# Patient Record
Sex: Female | Born: 1995 | Marital: Single
Health system: Northeastern US, Academic
[De-identification: ages and names within clinical notes are randomized; demographics above are authoritative.]

## PROBLEM LIST (undated history)

## (undated) ENCOUNTER — Inpatient Hospital Stay (HOSPITAL_COMMUNITY): Payer: Self-pay

## (undated) DIAGNOSIS — Z8669 Personal history of other diseases of the nervous system and sense organs: Secondary | ICD-10-CM

## (undated) DIAGNOSIS — Z973 Presence of spectacles and contact lenses: Secondary | ICD-10-CM

## (undated) DIAGNOSIS — J45909 Unspecified asthma, uncomplicated: Secondary | ICD-10-CM

## (undated) DIAGNOSIS — L988 Other specified disorders of the skin and subcutaneous tissue: Secondary | ICD-10-CM

## (undated) DIAGNOSIS — Z87798 Personal history of other (corrected) congenital malformations: Secondary | ICD-10-CM

---
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
## 2015-05-19 ENCOUNTER — Inpatient Hospital Stay (HOSPITAL_COMMUNITY)
Admission: AD | Admit: 2015-05-19 | Discharge: 2015-05-19 | Disposition: A | Discharge status: Home / Self Care | Payer: Self-pay | Source: Ambulatory Visit | Attending: Obstetrics & Gynecology | Admitting: Obstetrics & Gynecology

## 2015-05-19 DIAGNOSIS — N912 Amenorrhea, unspecified: Secondary | ICD-10-CM

## 2015-05-19 NOTE — MAU Provider Note (Signed)
Ms.Rillie Lajoyce Cornersvy is a 20 y.o. No obstetric history on file. at Unknown who presents to MAU today for pregnancy verification. The patient denies abdominal pain or vaginal bleeding today. The patient missed her period and desires a pregnancy test.   BP 121/66 mmHg  Pulse 95  Temp(Src) 99 F (37.2 C) (Oral)  Resp 16  SpO2 100%  LMP 04/13/2015  CONSTITUTIONAL: Well-developed, well-nourished female in no acute distress.  CARDIOVASCULAR: Regular heart rate RESPIRATORY: Normal effort NEUROLOGICAL: Alert and oriented to person, place, and time.  SKIN: Skin is warm and dry. No rash noted. Not diaphoretic. No erythema. No pallor. PSYCH: Normal mood and affect. Normal behavior. Normal judgment and thought content.  MDM Medical Screen Exam Complete  A: Amenorrhea  P: Discharge from MAU Patient advised to follow-up with [facility] for pregnancy confirmation  Monday-Thursday 8am-4pm or Friday 8am-11am Patient may return to MAU as needed or if her condition were to change or worsen   Duane LopeJennifer I Rasch, NP  05/19/2015 7:52 PM

## 2015-05-19 NOTE — MAU Note (Signed)
Pt reports LMP 04/13/2015, preg test always say "error:. Denies pain. Wants to confirm pregnant.

## 2015-07-14 ENCOUNTER — Ambulatory Visit (INDEPENDENT_AMBULATORY_CARE_PROVIDER_SITE_OTHER): Payer: 59 | Admitting: Urgent Care

## 2015-07-14 VITALS — BP 116/70 | HR 110 | Temp 99.4°F | Resp 18 | Ht 66.0 in | Wt 178.0 lb

## 2015-07-14 DIAGNOSIS — M791 Myalgia: Secondary | ICD-10-CM | POA: Diagnosis not present

## 2015-07-14 DIAGNOSIS — M7918 Myalgia, other site: Secondary | ICD-10-CM

## 2015-07-14 DIAGNOSIS — L0501 Pilonidal cyst with abscess: Secondary | ICD-10-CM | POA: Diagnosis not present

## 2015-07-14 LAB — POCT CBC
Granulocyte percent: 42.9 %G (ref 37–80)
HCT, POC: 34.7 % — AB (ref 37.7–47.9)
HEMOGLOBIN: 11.5 g/dL — AB (ref 12.2–16.2)
LYMPH, POC: 4.1 — AB (ref 0.6–3.4)
MCH, POC: 22.7 pg — AB (ref 27–31.2)
MCHC: 33.2 g/dL (ref 31.8–35.4)
MCV: 68.4 fL — AB (ref 80–97)
MID (cbc): 0.7 (ref 0–0.9)
MPV: 8.1 fL (ref 0–99.8)
PLATELET COUNT, POC: 225 10*3/uL (ref 142–424)
POC Granulocyte: 3.6 (ref 2–6.9)
POC LYMPH PERCENT: 49.3 %L (ref 10–50)
POC MID %: 7.8 % (ref 0–12)
RBC: 5.08 M/uL (ref 4.04–5.48)
RDW, POC: 16.5 %
WBC: 8.4 10*3/uL (ref 4.6–10.2)

## 2015-07-14 MED ORDER — TRAMADOL HCL 50 MG PO TABS: 50.0000 mg | ORAL_TABLET | Freq: Three times a day (TID) | ORAL | Status: DC | PRN

## 2015-07-14 MED ORDER — AMOXICILLIN-POT CLAVULANATE 875-125 MG PO TABS: 1.0000 | ORAL_TABLET | Freq: Two times a day (BID) | ORAL | Status: DC

## 2015-07-14 NOTE — Progress Notes (Signed)
    MRN: [id number] DOB: 09/24/1995  Subjective:   Chelsea Keith is a 20 y.o. female presenting for chief complaint of Recurrent Skin Infections  Reports 4 day history of painful bump over butt. Reports that yesterday, it spontaneously started draining pus and has continued to do so today. Had fever (highest was 101F) the past few days. Has used ibuprofen with some relief. Denies n/v, abdominal pain, dysuria, hematuria, constipation. Of note, the patient has had this problem before, was given antibiotics only ~1 year ago and problem never fully resolved.  Chelsea Keith currently has no medications in their medication list. Also has no allergies on file.  Chelsea Keith  has no past medical history on file. Also  has no past surgical history on file.  Objective:   Vitals: BP 116/70 mmHg  Pulse 110  Temp(Src) 99.4 F (37.4 C) (Oral)  Resp 18  Ht 5\' 6"  (1.676 m)  Wt 178 lb (80.74 kg)  BMI 28.74 kg/m2  SpO2 98%  Physical Exam  Constitutional: She is oriented to person, place, and time. She appears well-developed and well-nourished.  Cardiovascular:  Tachycardia.  Pulmonary/Chest: Effort normal.  Neurological: She is alert and oriented to person, place, and time.  Skin: Skin is warm and dry.       Results for orders placed or performed in visit on 07/14/15 (from the past 24 hour(s))  POCT CBC     Status: Abnormal   Collection Time: 07/14/15 12:53 PM  Result Value Ref Range   WBC 8.4 4.6 - 10.2 K/uL   Lymph, poc 4.1 (A) 0.6 - 3.4   POC LYMPH PERCENT 49.3 10 - 50 %L   MID (cbc) 0.7 0 - 0.9   POC MID % 7.8 0 - 12 %M   POC Granulocyte 3.6 2 - 6.9   Granulocyte percent 42.9 37 - 80 %G   RBC 5.08 4.04 - 5.48 M/uL   Hemoglobin 11.5 (A) 12.2 - 16.2 g/dL   HCT, POC 40.934.7 (A) 81.137.7 - 47.9 %   MCV 68.4 (A) 80 - 97 fL   MCH, POC 22.7 (A) 27 - 31.2 pg   MCHC 33.2 31.8 - 35.4 g/dL   RDW, POC 91.416.5 %   Platelet Count, POC 225 142 - 424 K/uL   MPV 8.1 0 - 99.8 fL    PROCEDURE NOTE: I&D of Pilonidal  Abscess Verbal consent obtained. Local anesthesia with 4cc of 1% lidocaine with epineprhine. Site cleansed with alcohol prep pad.  Incision of 2cm was made using a 11 blade, discharge of copious amounts of pus and serosanguinous fluid. Wound cavity was explored with curved hemostats with estimated depth of 3+cm. Wound aggressively packed with 1/2" plain packing. Cleansed and dressed.  Assessment and Plan :   1. Pilonidal abscess 2. Buttock pain - Will refer urgently to [facility] for management of pilonidal abscess. Wound culture pending, start Augmentin. - RTC as needed.   Wallis BambergMario Taliana Mersereau, PA-C [facility] [phone] 07/14/2015 12:35 PM

## 2015-07-14 NOTE — Addendum Note (Signed)
Addended by: Wallis BambergMANI, Johnanthony Wilden on: 07/14/2015 01:18 PM   Modules accepted: Level of Service

## 2015-07-14 NOTE — Patient Instructions (Signed)
Incision and Drainage of a Pilonidal Cyst Incision and drainage is a surgical procedure to open and drain a fluid-filled sac that forms around a hair follicle in the tailbone area between your buttocks (pilonidal cyst). You may need this procedure if the cyst becomes painful, swollen, or infected. There are three types of procedures that may be done. The type of procedure you have depends on the size and severity of your infected cyst. The procedure may be:  Incision and drainage with a special type of bandage (wound packing). Packing is used for wounds that are deep or tunnel under the skin.  Marsupialization. In this procedure, the cyst will be opened and kept open. The edges of the incision will be stitched together to make a pocket.  Incision and drainage without wound packing. LET YOUR HEALTH CARE PROVIDER KNOW ABOUT:  Any allergies you have.  All medicines you are taking, including vitamins, herbs, eye drops, creams, and over-the-counter medicines.  Previous problems you or members of your family have had with the use of anesthetics.  Any blood disorders you have.  Previous surgeries you have had.  Medical conditions you have. RISKS AND COMPLICATIONS Generally, this is a safe procedure. However, problems can occur and include:  Infection.  Bleeding.  Having another cyst develop.  Need for more surgery. BEFORE THE PROCEDURE  Ask your health care provider about:  Changing or stopping your regular medicines. This is especially important if you are taking diabetes medicines or blood thinners.  Taking medicines such as aspirin and ibuprofen. These medicines can thin your blood. Do not take these medicines before your procedure if your health care provider tells you not to.  Taking antibiotics before surgery to control the infection.  Do not eat or drink anything for 6-8 hours before the procedure if you are having general anesthesia.  Take a shower the night before the  procedure to clean your buttocks area. Take another shower in the morning before surgery.  Plan to have someone take you home after the procedure. PROCEDURE   You will have an IV tube inserted in a vein in your hand or arm.  You will be given one of the following:  A medicine that numbs the area (local anesthetic).  A medicine that makes you go to sleep (general anesthetic).  You also may be given medicine to help you relax during the procedure (sedative).  You will lie face down on the operating table.  Your buttocks area may be shaved.  Tape may be used to spread your buttocks.  Germ-killing solution (antiseptic) may be used to clean the area. Incision and Drainage With Wound Packing:  Your surgeon will make a surgical cut (incision) over the cyst to open it.  A probe may be used to see if there are tunnels extending away from the cyst under your skin.  Fluid or pus inside the cyst will be drained.  The cyst will be flushed out with a germ-free (sterile) solution.  Packing will be placed into the open cyst. This keeps it open and draining after surgery.  The area will be covered with a bandage (dressing). Marsupialization:  Your surgeon will make a surgical cut (incision) over the cyst to open it.  A probe may be used to see if there are tunnels extending away from the cyst under your skin.  Fluid or pus inside the cyst will be drained.  The cyst will be flushed out with a germ-free (sterile) solution.  The edges of   the incision will be stitched (sutured) to the skin to keep it wide open. The cyst will not be packed.  A rolled-up bandage (dressing) will be taped over the incision. Incision and Drainage Without Packing:  Your surgeon will make a surgical cut (incision) over the cyst to open it.  A probe may be used to see if there are tunnels extending away from the cyst under your skin.  Fluid or pus inside the cyst will be drained.  The cyst will be  flushed out with a germ-free (sterile) solution.  Your surgeon may also remove the tissue around the opened cyst.  The incision then will be closed with stitches (sutures). It will not be left open, and packing will not be used.  A bandage (dressing) will be put over the incision area. AFTER THE PROCEDURE  If you had general anesthesia, you will be taken to a recovery area. Your blood pressure, heart rate, breathing rate, and blood oxygen level will be monitored often until the medicines you were given have worn off.  It is normal to have some pain after this procedure. You may be given pain medicine.  Your IV tube can be taken out after you have recovered and your pain is under control.   This information is not intended to replace advice given to you by your health care provider. Make sure you discuss any questions you have with your health care provider.   Document Released: 07/15/2013 Document Reviewed: 07/15/2013 Elsevier Interactive Patient Education 2016 [employer]  

## 2015-07-17 LAB — WOUND CULTURE
GRAM STAIN: NONE SEEN
GRAM STAIN: NONE SEEN
Gram Stain: NONE SEEN

## 2015-07-18 ENCOUNTER — Encounter: Payer: Self-pay | Admitting: Urgent Care

## 2015-08-11 ENCOUNTER — Other Ambulatory Visit: Payer: Self-pay | Admitting: General Surgery

## 2015-08-11 NOTE — H&P (Signed)
History of Present Illness Chelsea Levee MD; 08/11/2015 3:04 PM) The patient is a 20 year old female who presents with a pilonidal cyst. Chelsea Keith is a 20 year old patient referred to [place] by Chelsea Bamberg, PA-C with [facility]. She presents with a history of pilonidal abscess that was I&D at the urgent care on 7/13. This is the second episode of this for her that first presented about a year ago. She said that one spontaneously drained and resolved. She reports having had pain, swelling, redness and fever to 101. She was placed on Augmentin at the urgent care. She says the pain is much better now.   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 Alcohol use Remotely quit alcohol use. Caffeine use Coffee, Tea.  Family History Chelsea Levee, MD; 08/11/2015 3:04 PM) Family history unknown First Degree Relatives  Pregnancy / Birth History Chelsea Levee, MD; 08/11/2015 3:04 PM) Age at menarche 11 years. Contraceptive History Depo-provera. Gravida 0 Irregular periods Para 0     Review of Systems Chelsea Levee MD; 08/11/2015 3:04 PM) General Present- Appetite Loss. Not Present- Chills, Fatigue, Fever, Night Sweats, Weight Gain and Weight Loss. Skin Not Present- Change in Wart/Mole, Dryness, Hives, Jaundice, New Lesions,  Non-Healing Wounds, Rash and Ulcer. HEENT Not Present- Earache, Hearing Loss, Hoarseness, Nose Bleed, Oral Ulcers, Ringing in the Ears, Seasonal Allergies, Sinus Pain, Sore Throat, Visual Disturbances, Wears glasses/contact lenses and Yellow Eyes. Respiratory Not Present- Bloody sputum, Chronic Cough, Difficulty Breathing, Snoring and Wheezing. Breast Not Present- Breast Mass, Breast Pain, Nipple Discharge and Skin Changes. Cardiovascular Not Present- Chest Pain, Difficulty Breathing Lying Down, Leg Cramps, Palpitations, Rapid Heart Rate, Shortness of Breath and Swelling of Extremities. Gastrointestinal Not Present- Abdominal Pain, Bloating, Bloody Stool, Change in Bowel Habits, Chronic diarrhea, Constipation, Difficulty Swallowing, Excessive gas, Gets full quickly at meals, Hemorrhoids, Indigestion, Nausea, Rectal Pain and Vomiting. Female Genitourinary Not Present- Frequency, Nocturia, Painful Urination, Pelvic Pain and Urgency. Musculoskeletal Not Present- Back Pain, Joint Pain, Joint Stiffness, Muscle Pain, Muscle Weakness and Swelling of Extremities. Neurological Not Present- Decreased Memory, Fainting, Headaches, Numbness, Seizures, Tingling, Tremor, Trouble walking and Weakness. Psychiatric Not Present- Anxiety, Bipolar, Change in Sleep Pattern, Depression, Fearful and Frequent crying. Endocrine Not Present- Cold Intolerance, Excessive Hunger, Hair Changes, Heat Intolerance, Hot flashes and New Diabetes. Hematology Not Present- Blood Thinners, Easy Bruising, Excessive bleeding, Gland problems, HIV and Persistent Infections.  Vitals (Sonya Bynum CMA; 08/11/2015 2:50 PM) 08/11/2015 2:49 PM Weight: 177 lb Height: 60in Body Surface Area: 1.77 m Body Mass Index: 34.57 kg/m  Temp.: 79F(Temporal)  Pulse: 75 (Regular)  BP: 118/70 (Sitting, Left Arm, Standard)      Physical Exam Chelsea Levee MD; 08/11/2015 3:05 PM)  General Mental Status-Alert. General  Appearance-Cooperative, Not in acute distress. Orientation-Oriented X4.  Integumentary General Characteristics Overall examination of the patient's skin reveals - no rashes. Color - normal coloration of skin. Skin Moisture - normal skin moisture.  Head and Neck Head-normocephalic, atraumatic with no lesions or palpable masses.  Chest and Lung Exam Chest and lung exam reveals -quiet, even and easy respiratory effort with no use of accessory muscles.  Rectal Note: There is a wound to the pilonidal area c/w recent I&D. No fluctuance. No purulent discharge. It is healing well  Neurologic Neurologic evaluation reveals -normal attention span and ability to concentrate and able to name objects and repeat phrases. Appropriate fund of knowledge .  Musculoskeletal Global Assessment Gait and Station - normal gait and station and normal posture.    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 On exam this appears to be healing well. She is requesting surgical excision. I think this is reasonable considering her recurrence. We discussed the surgery in detail. We discussed the small chance of recurrence even with the surgery. Her inflammation was more on the right side during her 2 infections. We discussed the possibility of prolonged wound healing as well.

## 2015-09-06 ENCOUNTER — Encounter (HOSPITAL_BASED_OUTPATIENT_CLINIC_OR_DEPARTMENT_OTHER): Payer: Self-pay | Admitting: *Deleted

## 2015-09-07 ENCOUNTER — Encounter (HOSPITAL_BASED_OUTPATIENT_CLINIC_OR_DEPARTMENT_OTHER): Payer: Self-pay | Admitting: *Deleted

## 2015-09-07 NOTE — Progress Notes (Signed)
NPO AFTER MN WITH EXCEPTION CLEAR LIQUIDS UNTIL 0730 (NO CREAM/ MILK PRODUCTS).  ARRIVE AT 1200. NEEDS HG AND URINE PREG.  

## 2015-09-09 ENCOUNTER — Ambulatory Visit (HOSPITAL_BASED_OUTPATIENT_CLINIC_OR_DEPARTMENT_OTHER): Payer: BLUE CROSS/BLUE SHIELD | Admitting: Anesthesiology

## 2015-09-09 ENCOUNTER — Encounter (HOSPITAL_BASED_OUTPATIENT_CLINIC_OR_DEPARTMENT_OTHER): Payer: Self-pay | Admitting: *Deleted

## 2015-09-09 ENCOUNTER — Ambulatory Visit (HOSPITAL_BASED_OUTPATIENT_CLINIC_OR_DEPARTMENT_OTHER)
Admission: RE | Admit: 2015-09-09 | Discharge: 2015-09-09 | Disposition: A | Discharge status: Home / Self Care | Payer: BLUE CROSS/BLUE SHIELD | Source: Ambulatory Visit | Attending: General Surgery | Admitting: General Surgery

## 2015-09-09 ENCOUNTER — Encounter (HOSPITAL_BASED_OUTPATIENT_CLINIC_OR_DEPARTMENT_OTHER)
Admission: RE | Disposition: A | Discharge status: Home / Self Care | Payer: Self-pay | Source: Ambulatory Visit | Attending: General Surgery

## 2015-09-09 DIAGNOSIS — L0591 Pilonidal cyst without abscess: Secondary | ICD-10-CM | POA: Insufficient documentation

## 2015-09-09 HISTORY — DX: Unspecified asthma, uncomplicated: J45.909

## 2015-09-09 HISTORY — PX: PILONIDAL CYST EXCISION: SHX744

## 2015-09-09 HISTORY — DX: Personal history of other diseases of the nervous system and sense organs: Z86.69

## 2015-09-09 HISTORY — DX: Other specified disorders of the skin and subcutaneous tissue: L98.8

## 2015-09-09 HISTORY — DX: Personal history of other (corrected) congenital malformations: Z87.798

## 2015-09-09 HISTORY — DX: Presence of spectacles and contact lenses: Z97.3

## 2015-09-09 LAB — POCT PREGNANCY, URINE: Preg Test, Ur: NEGATIVE

## 2015-09-09 LAB — POCT HEMOGLOBIN-HEMACUE: HEMOGLOBIN: 11.3 g/dL — AB (ref 12.0–15.0)

## 2015-09-09 SURGERY — EXCISION, PILONIDAL CYST, EXTENSIVE
Anesthesia: Monitor Anesthesia Care

## 2015-09-09 MED ORDER — OXYCODONE HCL 5 MG PO TABS
5.0000 mg | ORAL_TABLET | ORAL | Status: DC | PRN
  Filled 2015-09-09: qty 2

## 2015-09-09 MED ORDER — LACTATED RINGERS IV SOLN
INTRAVENOUS | Status: DC
  Administered 2015-09-09: 13:00:00 via INTRAVENOUS
  Filled 2015-09-09: qty 1000

## 2015-09-09 MED ORDER — FENTANYL CITRATE (PF) 100 MCG/2ML IJ SOLN
INTRAMUSCULAR | Status: AC
  Filled 2015-09-09: qty 2

## 2015-09-09 MED ORDER — LIDOCAINE 2% (20 MG/ML) 5 ML SYRINGE
INTRAMUSCULAR | Status: AC
  Filled 2015-09-09: qty 5

## 2015-09-09 MED ORDER — DEXAMETHASONE SODIUM PHOSPHATE 4 MG/ML IJ SOLN
INTRAMUSCULAR | Status: DC | PRN
  Administered 2015-09-09: 10 mg via INTRAVENOUS

## 2015-09-09 MED ORDER — ONDANSETRON HCL 4 MG/2ML IJ SOLN
4.0000 mg | Freq: Once | INTRAMUSCULAR | Status: DC | PRN
  Filled 2015-09-09: qty 2

## 2015-09-09 MED ORDER — HYDROCODONE-ACETAMINOPHEN 5-325 MG PO TABS: 1.0000 | ORAL_TABLET | ORAL | 0 refills | Status: DC | PRN

## 2015-09-09 MED ORDER — OXYCODONE HCL 5 MG PO TABS
5.0000 mg | ORAL_TABLET | Freq: Once | ORAL | Status: DC | PRN
  Filled 2015-09-09: qty 1

## 2015-09-09 MED ORDER — FENTANYL CITRATE (PF) 100 MCG/2ML IJ SOLN
INTRAMUSCULAR | Status: DC | PRN
  Administered 2015-09-09 (×2): 50 ug via INTRAVENOUS

## 2015-09-09 MED ORDER — SODIUM CHLORIDE 0.9 % IV SOLN
INTRAVENOUS | Status: DC | PRN
  Administered 2015-09-09: 20 ug/kg/min via INTRAVENOUS

## 2015-09-09 MED ORDER — ONDANSETRON HCL 4 MG/2ML IJ SOLN
INTRAMUSCULAR | Status: AC
  Filled 2015-09-09: qty 2

## 2015-09-09 MED ORDER — SODIUM CHLORIDE 0.9% FLUSH
3.0000 mL | INTRAVENOUS | Status: DC | PRN
  Filled 2015-09-09: qty 3

## 2015-09-09 MED ORDER — MIDAZOLAM HCL 2 MG/2ML IJ SOLN
INTRAMUSCULAR | Status: AC
  Filled 2015-09-09: qty 2

## 2015-09-09 MED ORDER — OXYCODONE HCL 5 MG/5ML PO SOLN
5.0000 mg | Freq: Once | ORAL | Status: DC | PRN
  Filled 2015-09-09: qty 5

## 2015-09-09 MED ORDER — LIDOCAINE 2% (20 MG/ML) 5 ML SYRINGE
INTRAMUSCULAR | Status: DC | PRN
  Administered 2015-09-09: 50 mg via INTRAVENOUS

## 2015-09-09 MED ORDER — BUPIVACAINE-EPINEPHRINE 0.5% -1:200000 IJ SOLN
INTRAMUSCULAR | Status: DC | PRN
  Administered 2015-09-09: 30 mL

## 2015-09-09 MED ORDER — ACETAMINOPHEN 500 MG PO TABS
ORAL_TABLET | ORAL | Status: AC
  Filled 2015-09-09: qty 2

## 2015-09-09 MED ORDER — SODIUM CHLORIDE 0.9% FLUSH
3.0000 mL | Freq: Two times a day (BID) | INTRAVENOUS | Status: DC
  Filled 2015-09-09: qty 3

## 2015-09-09 MED ORDER — ACETAMINOPHEN 650 MG RE SUPP
650.0000 mg | RECTAL | Status: DC | PRN
  Filled 2015-09-09: qty 1

## 2015-09-09 MED ORDER — MIDAZOLAM HCL 5 MG/5ML IJ SOLN
INTRAMUSCULAR | Status: DC | PRN
  Administered 2015-09-09: 2 mg via INTRAVENOUS

## 2015-09-09 MED ORDER — KETAMINE HCL 10 MG/ML IJ SOLN
INTRAMUSCULAR | Status: AC
  Filled 2015-09-09: qty 1

## 2015-09-09 MED ORDER — ACETAMINOPHEN 325 MG PO TABS
650.0000 mg | ORAL_TABLET | ORAL | Status: DC | PRN
  Filled 2015-09-09: qty 2

## 2015-09-09 MED ORDER — ACETAMINOPHEN 500 MG PO TABS
1000.0000 mg | ORAL_TABLET | ORAL | Status: AC
  Administered 2015-09-09: 1000 mg via ORAL
  Filled 2015-09-09: qty 2

## 2015-09-09 MED ORDER — GABAPENTIN 300 MG PO CAPS
300.0000 mg | ORAL_CAPSULE | ORAL | Status: AC
  Administered 2015-09-09: 300 mg via ORAL
  Filled 2015-09-09 (×2): qty 1

## 2015-09-09 MED ORDER — PROPOFOL 500 MG/50ML IV EMUL
INTRAVENOUS | Status: DC | PRN
  Administered 2015-09-09: 200 ug/kg/min via INTRAVENOUS

## 2015-09-09 MED ORDER — CEFAZOLIN SODIUM-DEXTROSE 2-4 GM/100ML-% IV SOLN
2.0000 g | INTRAVENOUS | Status: AC
  Administered 2015-09-09: 2 g via INTRAVENOUS
  Filled 2015-09-09: qty 100

## 2015-09-09 MED ORDER — MEPERIDINE HCL 25 MG/ML IJ SOLN
6.2500 mg | INTRAMUSCULAR | Status: DC | PRN
  Filled 2015-09-09: qty 1

## 2015-09-09 MED ORDER — PROPOFOL 500 MG/50ML IV EMUL
INTRAVENOUS | Status: AC
  Filled 2015-09-09: qty 50

## 2015-09-09 MED ORDER — SODIUM CHLORIDE 0.9 % IV SOLN
250.0000 mL | INTRAVENOUS | Status: DC | PRN
  Filled 2015-09-09: qty 250

## 2015-09-09 MED ORDER — HYDROMORPHONE HCL 1 MG/ML IJ SOLN
0.2500 mg | INTRAMUSCULAR | Status: DC | PRN
  Filled 2015-09-09: qty 1

## 2015-09-09 MED ORDER — CEFAZOLIN SODIUM-DEXTROSE 2-4 GM/100ML-% IV SOLN
INTRAVENOUS | Status: AC
  Filled 2015-09-09: qty 100

## 2015-09-09 MED ORDER — ONDANSETRON HCL 4 MG/2ML IJ SOLN
INTRAMUSCULAR | Status: DC | PRN
  Administered 2015-09-09: 4 mg via INTRAVENOUS

## 2015-09-09 SURGICAL SUPPLY — 48 items
BENZOIN TINCTURE PRP APPL 2/3 (GAUZE/BANDAGES/DRESSINGS) ×3 IMPLANT
BLADE HEX COATED 2.75 (ELECTRODE) ×3 IMPLANT
BLADE SURG 10 STRL SS (BLADE) ×3 IMPLANT
BLADE SURG 15 STRL LF DISP TIS (BLADE) ×1 IMPLANT
BLADE SURG 15 STRL SS (BLADE) ×2
BRIEF STRETCH FOR OB PAD LRG (UNDERPADS AND DIAPERS) ×3 IMPLANT
CANISTER SUCTION 2500CC (MISCELLANEOUS) ×3 IMPLANT
COVER BACK TABLE 60X90IN (DRAPES) ×3 IMPLANT
COVER MAYO STAND STRL (DRAPES) ×3 IMPLANT
DRAIN PENROSE 18X1/4 LTX STRL (WOUND CARE) ×3 IMPLANT
DRAPE LAPAROTOMY 100X72 PEDS (DRAPES) ×3 IMPLANT
DRAPE UTILITY XL STRL (DRAPES) ×3 IMPLANT
ELECT BLADE 6.5 .24CM SHAFT (ELECTRODE) IMPLANT
ELECT REM PT RETURN 9FT ADLT (ELECTROSURGICAL) ×3
ELECTRODE REM PT RTRN 9FT ADLT (ELECTROSURGICAL) ×1 IMPLANT
GAUZE SPONGE 4X4 16PLY NS LF (WOUND CARE) IMPLANT
GAUZE SPONGE 4X4 16PLY XRAY LF (GAUZE/BANDAGES/DRESSINGS) IMPLANT
GAUZE VASELINE 3X9 (GAUZE/BANDAGES/DRESSINGS) IMPLANT
GLOVE BIO SURGEON STRL SZ 6.5 (GLOVE) ×2 IMPLANT
GLOVE BIO SURGEONS STRL SZ 6.5 (GLOVE) ×1
GLOVE INDICATOR 7.0 STRL GRN (GLOVE) ×3 IMPLANT
GOWN STRL REUS W/ TWL LRG LVL3 (GOWN DISPOSABLE) ×1 IMPLANT
GOWN STRL REUS W/TWL 2XL LVL3 (GOWN DISPOSABLE) ×3 IMPLANT
GOWN STRL REUS W/TWL LRG LVL3 (GOWN DISPOSABLE) ×2
KIT ROOM TURNOVER WOR (KITS) ×3 IMPLANT
LIQUID BAND (GAUZE/BANDAGES/DRESSINGS) ×3 IMPLANT
NDL SAFETY ECLIPSE 18X1.5 (NEEDLE) IMPLANT
NEEDLE HYPO 18GX1.5 SHARP (NEEDLE)
NEEDLE HYPO 22GX1.5 SAFETY (NEEDLE) ×3 IMPLANT
NS IRRIG 500ML POUR BTL (IV SOLUTION) ×3 IMPLANT
PACK BASIN DAY SURGERY FS (CUSTOM PROCEDURE TRAY) ×3 IMPLANT
PAD ABD 8X10 STRL (GAUZE/BANDAGES/DRESSINGS) ×3 IMPLANT
PAD ARMBOARD 7.5X6 YLW CONV (MISCELLANEOUS) ×3 IMPLANT
PENCIL BUTTON HOLSTER BLD 10FT (ELECTRODE) ×3 IMPLANT
SOL PREP POV-IOD 16OZ 10% (MISCELLANEOUS) ×6 IMPLANT
SPONGE LAP 18X18 X RAY DECT (DISPOSABLE) IMPLANT
SPONGE LAP 4X18 X RAY DECT (DISPOSABLE) IMPLANT
SPONGE SURGIFOAM ABS GEL 12-7 (HEMOSTASIS) IMPLANT
SUT ETHILON 2 0 PS N (SUTURE) ×6 IMPLANT
SUT VIC AB 2-0 SH 18 (SUTURE) ×3 IMPLANT
SUT VIC AB 3-0 SH 18 (SUTURE) ×3 IMPLANT
SYR BULB IRRIGATION 50ML (SYRINGE) ×3 IMPLANT
SYR CONTROL 10ML LL (SYRINGE) ×3 IMPLANT
TOWEL OR 17X24 6PK STRL BLUE (TOWEL DISPOSABLE) ×6 IMPLANT
TRAY DSU PREP LF (CUSTOM PROCEDURE TRAY) ×3 IMPLANT
TUBE CONNECTING 12'X1/4 (SUCTIONS) ×1
TUBE CONNECTING 12X1/4 (SUCTIONS) ×2 IMPLANT
YANKAUER SUCT BULB TIP NO VENT (SUCTIONS) ×3 IMPLANT

## 2015-09-09 NOTE — Anesthesia Postprocedure Evaluation (Signed)
Anesthesia Post Note  Patient: Chelsea Keith  Procedure(s) Performed: Procedure(s) (LRB): EXCISION PILONIDAL CYST (N/A)  Patient location during evaluation: PACU Anesthesia Type: MAC Level of consciousness: awake and alert Pain management: pain level controlled Vital Signs Assessment: post-procedure vital signs reviewed and stable Respiratory status: spontaneous breathing, nonlabored ventilation, respiratory function stable and patient connected to nasal cannula oxygen Cardiovascular status: stable and blood pressure returned to baseline Anesthetic complications: no    Last Vitals:  Vitals:   09/09/15 1236 09/09/15 1432  BP: 115/67 110/69  Pulse: 80 88  Resp: 16 19  Temp: 37 C 36.9 C    Last Pain:  Vitals:   09/09/15 1432  TempSrc:   PainSc: Asleep                 Nicolas Sisler A

## 2015-09-09 NOTE — Anesthesia Procedure Notes (Signed)
Procedure Name: MAC Date/Time: 09/09/2015 1:40 PM Performed by: Ivin BootyREWS, DAVID Pre-anesthesia Checklist: Patient identified, Emergency Drugs available, Suction available, Patient being monitored and Timeout performed Patient Re-evaluated:Patient Re-evaluated prior to inductionOxygen Delivery Method: Nasal cannula Intubation Type: IV induction Placement Confirmation: positive ETCO2 and breath sounds checked- equal and bilateral

## 2015-09-09 NOTE — Interval H&P Note (Signed)
History and Physical Interval Note:  09/09/2015 1:37 PM  Chelsea Keith  has presented today for surgery, with the diagnosis of pilonidal disease  The various methods of treatment have been discussed with the patient and family. After consideration of risks, benefits and other options for treatment, the patient has consented to  Procedure(s): EXCISION PILONIDAL DISEASE (N/A) as a surgical intervention .  The patient's history has been reviewed, patient examined, no change in status, stable for surgery.  I have reviewed the patient's chart and labs.  Questions were answered to the patient's satisfaction.    Vanita PandaAlicia C Lanisha Stepanian, MD  Colorectal and General Surgery [facility]

## 2015-09-09 NOTE — Anesthesia Preprocedure Evaluation (Addendum)
Anesthesia Evaluation  Patient identified by MRN, date of birth, ID band Patient awake    Reviewed: Allergy & Precautions, NPO status , Patient's Chart, lab work & pertinent test results  Airway Mallampati: I       Dental  (+) Teeth Intact, Dental Advisory Given   Pulmonary    breath sounds clear to auscultation       Cardiovascular  Rhythm:Regular Rate:Normal     Neuro/Psych    GI/Hepatic   Endo/Other    Renal/GU      Musculoskeletal   Abdominal   Peds  Hematology   Anesthesia Other Findings   Reproductive/Obstetrics                             Anesthesia Physical Anesthesia Plan  ASA: II  Anesthesia Plan: MAC   Post-op Pain Management:    Induction: Intravenous  Airway Management Planned: Simple Face Mask  Additional Equipment:   Intra-op Plan:   Post-operative Plan:   Informed Consent: I have reviewed the patients History and Physical, chart, labs and discussed the procedure including the risks, benefits and alternatives for the proposed anesthesia with the patient or authorized representative who has indicated his/her understanding and acceptance.   Dental advisory given  Plan Discussed with: CRNA, Anesthesiologist and Surgeon  Anesthesia Plan Comments:         Anesthesia Quick Evaluation

## 2015-09-09 NOTE — Discharge Instructions (Addendum)
GENERAL SURGERY: POST OP INSTRUCTIONS  1. DIET: Follow a light bland diet the first 24 hours after arrival home, such as soup, liquids, crackers, etc.  Be sure to include lots of fluids daily.  Avoid fast food or heavy meals as your are more likely to get nauseated.   2. Take your usually prescribed home medications unless otherwise directed. 3. PAIN CONTROL: a. Pain is best controlled by a usual combination of three different methods TOGETHER: i. Ice/Heat ii. Over the counter pain medication iii. Prescription pain medication b. Most patients will experience some swelling and bruising around the incisions.  Ice packs or heating pads (30-60 minutes up to 6 times a day) will help. Use ice for the first few days to help decrease swelling and bruising, then switch to heat to help relax tight/sore spots and speed recovery.  Some people prefer to use ice alone, heat alone, alternating between ice & heat.  Experiment to what works for you.  Swelling and bruising can take several weeks to resolve.   c. It is helpful to take an over-the-counter pain medication regularly for the first few weeks.  Choose one of the following that works best for you: i. Naproxen (Aleve, etc)  Two 220mg  tabs twice a day ii. Ibuprofen (Advil, etc) Three 200mg  tabs four times a day (every meal & bedtime) d. A  prescription for pain medication (such as Percocet, oxycodone, hydrocodone, etc) should be given to you upon discharge.  Take your pain medication as prescribed.  i. If you are having problems/concerns with the prescription medicine (does not control pain, nausea, vomiting, rash, itching, etc), please call us [phone] to see if we need to switch you to a different pain medicine that will work better for you and/or control your side effect better. ii. If you need a refill on your pain medication, please contact your pharmacy.  They will contact our office to request authorization. Prescriptions will not be filled after 5  pm or on week-ends. 4. Avoid getting constipated.  Between the surgery and the pain medications, it is common to experience some constipation.  Increasing fluid intake and taking a fiber supplement (such as Metamucil, Citrucel, FiberCon, MiraLax, etc) 1-2 times a day regularly will usually help prevent this problem from occurring.  A mild laxative (prune juice, Milk of Magnesia, MiraLax, etc) should be taken according to package directions if there are no bowel movements after 48 hours.   5. Wash / shower every day.  You may take the dressing off to shower. 6.  Change your dressing every day and when it gets soiled.  Expect quite a bit of drainage over the first 1-2 weeks. 7. ACTIVITIES as tolerated:   a. You may resume regular (light) daily activities beginning the next day--such as daily self-care, walking, climbing stairs--gradually increasing activities as tolerated.  Leave more intense activity such as jogging, treadmill, bicycling, low-impact aerobics, swimming, etc. Until after you are 3 weeks out from surgery. b. Save the most intensive and strenuous activity for last such as sit-ups, heavy lifting, contact sports, etc  Refrain from any heavy lifting or straining until you are off narcotics for pain control.   c. DO NOT PUSH THROUGH PAIN.  Let pain be your guide: If it hurts to do something, don't do it.  Pain is your body warning you to avoid that activity for another week until the pain goes down. d. You may drive when you are no longer taking prescription pain medication, you  can comfortably wear a seatbelt, and you can safely maneuver your car and apply brakes. e. Bonita Quin may have sexual intercourse when it is comfortable.  8. FOLLOW UP in our office a. Please call [facility] at [phone] to set up an appointment to see your surgeon in the office for a follow-up appointment approximately 2-3 weeks after your surgery. b. Make sure that you call for this appointment the day you arrive home to insure  a convenient appointment time. 9. IF YOU HAVE DISABILITY OR FAMILY LEAVE FORMS, BRING THEM TO THE OFFICE FOR PROCESSING.  DO NOT GIVE THEM TO YOUR DOCTOR.   WHEN TO CALL us [phone]: 1. Poor pain control 2. Reactions / problems with new medications (rash/itching, nausea, etc)  3. Fever over 101.5 F (38.5 C) 4. Worsening swelling or bruising 5. Continued bleeding from incision. 6. Increased pain, redness, or drainage from the incision   The clinic staff is available to answer your questions during regular business hours (8:30am-5pm).  Please dont hesitate to call and ask to speak to one of our nurses for clinical concerns.   If you have a medical emergency, go to the nearest emergency room or call 911.  A surgeon from [facility] is always on call at the [facility], [place] [address], [place], [place]  [address] ? MAIN: [phone] ? TOLL FREE: [phone] ?  FAX [phone] [email or web address]   Post Anesthesia Home Care Instructions  Activity: Get plenty of rest for the remainder of the day. A responsible adult should stay with you for 24 hours following the procedure.  For the next 24 hours, DO NOT: -Drive a car -Advertising copywriter -Drink alcoholic beverages -Take any medication unless instructed by your physician -Make any legal decisions or sign important papers.  Meals: Start with liquid foods such as gelatin or soup. Progress to regular foods as tolerated. Avoid greasy, spicy, heavy foods. If nausea and/or vomiting occur, drink only clear liquids until the nausea and/or vomiting subsides. Call your physician if vomiting continues.  Special Instructions/Symptoms: Your throat may feel dry or sore from the anesthesia or the breathing tube placed in your throat during surgery. If this causes discomfort, gargle with warm salt water. The discomfort should disappear within 24 hours.  If you had a  scopolamine patch placed behind your ear for the management of post- operative nausea and/or vomiting:  1. The medication in the patch is effective for 72 hours, after which it should be removed.  Wrap patch in a tissue and discard in the trash. Wash hands thoroughly with soap and water. 2. You may remove the patch earlier than 72 hours if you experience unpleasant side effects which may include dry mouth, dizziness or visual disturbances. 3. Avoid touching the patch. Wash your hands with soap and water after contact with the patch.

## 2015-09-09 NOTE — H&P (View-Only) (Signed)
History of Present Illness Chelsea Keith(Marston Mccadden MD; 08/11/2015 3:04 PM) The patient is a 20 year old female who presents with a pilonidal cyst. Chelsea SonsBriana Keith is a 20 year old patient referred to us by Wallis BambergMario Mani, PA-C with [facility]. She presents with a history of pilonidal abscess that was I&D at the urgent care on 7/13. This is the second episode of this for her that first presented about a year ago. She said that one spontaneously drained and resolved. She reports having had pain, swelling, redness and fever to 101. She was placed on Augmentin at the urgent care. She says the pain is much better now.   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) PILONIDAL ABSCESS (L05.01)  Other Problems Chelsea Keith(Vipul Cafarelli, MD; 08/11/2015 3:04 PM) No pertinent past medical history  Past Surgical History Chelsea Keith(Josealfredo Adkins, MD; 08/11/2015 3:04 PM) No pertinent past surgical history  Diagnostic Studies History Chelsea Keith(Jagger Beahm, MD; 08/11/2015 3:04 PM) Colonoscopy never Pap Smear never  Allergies (Sonya Bynum, CMA; 08/11/2015 2:50 PM) No Known Drug Allergies 07/21/2015  Medication History (Sonya Bynum, CMA; 08/11/2015 2:50 PM) No Current Medications Medications Reconciled  Social History Chelsea Keith(Ladarrion Telfair, MD; 08/11/2015 3:04 PM) No drug use Tobacco use Never smoker. Alcohol use Remotely quit alcohol use. Caffeine use Coffee, Tea.  Family History Chelsea Keith(Laurens Matheny, MD; 08/11/2015 3:04 PM) Family history unknown First Degree Relatives  Pregnancy / Birth History Chelsea Keith(Kareem Aul, MD; 08/11/2015 3:04 PM) Age at menarche 11 years. Contraceptive History Depo-provera. Gravida 0 Irregular periods Para 0     Review of Systems Chelsea Keith(Tedrick Port MD; 08/11/2015 3:04 PM) General Present- Appetite Loss. Not Present- Chills, Fatigue, Fever, Night Sweats, Weight Gain and Weight Loss. Skin Not Present- Change in Wart/Mole, Dryness, Hives, Jaundice, New Lesions,  Non-Healing Wounds, Rash and Ulcer. HEENT Not Present- Earache, Hearing Loss, Hoarseness, Nose Bleed, Oral Ulcers, Ringing in the Ears, Seasonal Allergies, Sinus Pain, Sore Throat, Visual Disturbances, Wears glasses/contact lenses and Yellow Eyes. Respiratory Not Present- Bloody sputum, Chronic Cough, Difficulty Breathing, Snoring and Wheezing. Breast Not Present- Breast Mass, Breast Pain, Nipple Discharge and Skin Changes. Cardiovascular Not Present- Chest Pain, Difficulty Breathing Lying Down, Leg Cramps, Palpitations, Rapid Heart Rate, Shortness of Breath and Swelling of Extremities. Gastrointestinal Not Present- Abdominal Pain, Bloating, Bloody Stool, Change in Bowel Habits, Chronic diarrhea, Constipation, Difficulty Swallowing, Excessive gas, Gets full quickly at meals, Hemorrhoids, Indigestion, Nausea, Rectal Pain and Vomiting. Female Genitourinary Not Present- Frequency, Nocturia, Painful Urination, Pelvic Pain and Urgency. Musculoskeletal Not Present- Back Pain, Joint Pain, Joint Stiffness, Muscle Pain, Muscle Weakness and Swelling of Extremities. Neurological Not Present- Decreased Memory, Fainting, Headaches, Numbness, Seizures, Tingling, Tremor, Trouble walking and Weakness. Psychiatric Not Present- Anxiety, Bipolar, Change in Sleep Pattern, Depression, Fearful and Frequent crying. Endocrine Not Present- Cold Intolerance, Excessive Hunger, Hair Changes, Heat Intolerance, Hot flashes and New Diabetes. Hematology Not Present- Blood Thinners, Easy Bruising, Excessive bleeding, Gland problems, HIV and Persistent Infections.  Vitals (Sonya Bynum CMA; 08/11/2015 2:50 PM) 08/11/2015 2:49 PM Weight: 177 lb Height: 60in Body Surface Area: 1.77 m Body Mass Index: 34.57 kg/m  Temp.: 59F(Temporal)  Pulse: 75 (Regular)  BP: 118/70 (Sitting, Left Arm, Standard)      Physical Exam Chelsea Keith(Mearle Drew MD; 08/11/2015 3:05 PM)  General Mental Status-Alert. General  Appearance-Cooperative, Not in acute distress. Orientation-Oriented X4.  Integumentary General Characteristics Overall examination of the patient's skin reveals - no rashes. Color - normal coloration of skin. Skin Moisture - normal skin moisture.  Head and Neck Head-normocephalic, atraumatic with no lesions or palpable masses.  Chest and Lung Exam Chest and lung exam reveals -quiet, even and easy respiratory effort with no use of accessory muscles.  Rectal Note: There is a wound to the pilonidal area c/w recent I&D. No fluctuance. No purulent discharge. It is healing well  Neurologic Neurologic evaluation reveals -normal attention span and ability to concentrate and able to name objects and repeat phrases. Appropriate fund of knowledge .  Musculoskeletal Global Assessment Gait and Station - normal gait and station and normal posture.    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 On exam this appears to be healing well. She is requesting surgical excision. I think this is reasonable considering her recurrence. We discussed the surgery in detail. We discussed the small chance of recurrence even with the surgery. Her inflammation was more on the right side during her 2 infections. We discussed the possibility of prolonged wound healing as well.

## 2015-09-09 NOTE — Op Note (Signed)
09/09/2015  2:29 PM  PATIENT:  Chelsea Keith  20 y.o. female  Patient Care Team: No Pcp Per Patient as PCP - General (General Practice)  PRE-OPERATIVE DIAGNOSIS:  pilonidal disease  POST-OPERATIVE DIAGNOSIS:  pilonidal disease  PROCEDURE:  Procedure(s): EXCISION PILONIDAL CYST  SURGEON:  Surgeon(s): Romie LeveeAlicia Carlynn Leduc, MD  ASSISTANT: none   ANESTHESIA:   local  EBL:  Total I/O In: 500 [I.V.:500] Out: 5 [Blood:5]  DRAINS: Penrose drain in the wound bed   SPECIMEN:  No Specimen  DISPOSITION OF SPECIMEN:  N/A  COUNTS:  YES  PLAN OF CARE: Discharge to home after PACU  PATIENT DISPOSITION:  PACU - hemodynamically stable.  INDICATION: 20 year old female with 2 previous episodes of pilonidal infection requiring I&D.   OR FINDINGS: Midline pilonidal scar, no pilonidal pit identified  DESCRIPTION: the patient was identified in the preoperative holding area and taken to the OR where they were laid prone on the operating room table.  MAC anesthesia was induced without difficulty. SCDs were also noted to be in place prior to the initiation of anesthesia.  The patient was then prepped and draped in the usual sterile fashion.   A surgical timeout was performed indicating the correct patient, procedure, positioning and need for preoperative antibiotics.  A field block was then obtained using Marcaine with epinephrine for local anesthesia.   I began making an incision lateral to the wound on the patient's right gluteus area and this was carried down through the subcutaneous tissues and underneath the pocket of inflammation. Midline was crossed and the cavity was freed on the left side as well. Once all away around the cavity posteriorly, I began to separated anteriorly from the dermal layer using a combination of electrocautery and sharp dissection. Once this was completely free it was removed from the wound. The wound was irrigated with Betadine and saline. Hemostasis was good. The  subcutaneous tissue was reapproximated over a quarter inch Penrose drain in the bed of the wound. This was done using interrupted 2-0 Vicryl sutures. The dermal layer was closed using interrupted 3-0 Vicryl sutures. The skin was closed using interrupted 2-0 nylon mattress sutures. The drain was secured to the posterior aspect of the wound also using the nylon suture.  A sterile dressing was applied. The patient was awakened from anesthesia and sent to the post anesthesia care unit in stable condition. All counts were correct per operating room staff.

## 2015-09-09 NOTE — Transfer of Care (Signed)
Last Vitals:  Vitals:   09/09/15 1236 09/09/15 1432  BP: 115/67 (P) 110/69  Pulse: 80   Resp: 16 (P) 19  Temp: 37 C     Last Pain:  Vitals:   09/09/15 1236  TempSrc: Oral      Patients Stated Pain Goal: 5 (09/09/15 1304)  Immediate Anesthesia Transfer of Care Note  Patient: Chelsea Keith  Procedure(s) Performed: Procedure(s) (LRB): EXCISION PILONIDAL CYST (N/A)  Patient Location: PACU  Anesthesia Type: General  Level of Consciousness: awake, alert  and oriented  Airway & Oxygen Therapy: Patient Spontanous Breathing and Patient connected to nasal cannua oxygen  Post-op Assessment: Report given to PACU RN and Post -op Vital signs reviewed and stable  Post vital signs: Reviewed and stable  Complications: No apparent anesthesia complications

## 2015-09-12 ENCOUNTER — Encounter (HOSPITAL_BASED_OUTPATIENT_CLINIC_OR_DEPARTMENT_OTHER): Payer: Self-pay | Admitting: General Surgery

## 2017-01-18 ENCOUNTER — Other Ambulatory Visit: Payer: Self-pay

## 2017-01-18 ENCOUNTER — Ambulatory Visit: Payer: No Typology Code available for payment source | Admitting: Family Medicine

## 2017-01-18 ENCOUNTER — Encounter: Payer: Self-pay | Admitting: Family Medicine

## 2017-01-18 VITALS — BP 122/62 | HR 92 | Temp 98.0°F | Ht 65.35 in | Wt 203.0 lb

## 2017-01-18 DIAGNOSIS — H1032 Unspecified acute conjunctivitis, left eye: Secondary | ICD-10-CM | POA: Diagnosis not present

## 2017-01-18 MED ORDER — POLYMYXIN B-TRIMETHOPRIM 10000-0.1 UNIT/ML-% OP SOLN: 1.0000 [drp] | Freq: Four times a day (QID) | OPHTHALMIC | 0 refills | Status: DC

## 2017-01-18 NOTE — Patient Instructions (Addendum)
   IF you received an x-ray today, you will receive an invoice from [facility]. Please contact [facility] at [phone] with questions or concerns regarding your invoice.   IF you received labwork today, you will receive an invoice from [facility]. Please contact [facility] at [phone] with questions or concerns regarding your invoice.   Our billing staff will not be able to assist you with questions regarding bills from these companies.  You will be contacted with the lab results as soon as they are available. The fastest way to get your results is to activate your My Chart account. Instructions are located on the last page of this paperwork. If you have not heard from us regarding the results in 2 weeks, please contact this office.      Bacterial Conjunctivitis Bacterial conjunctivitis is an infection of the clear membrane that covers the white part of your eye and the inner surface of your eyelid (conjunctiva). When the blood vessels in your conjunctiva become inflamed, your eye becomes red or pink, and it will probably feel itchy. Bacterial conjunctivitis spreads very easily from person to person (is contagious). It also spreads easily from one eye to the other eye. What are the causes? This condition is caused by several common bacteria. You may get the infection if you come into close contact with another person who is infected. You may also come into contact with items that are contaminated with the bacteria, such as a face towel, contact lens solution, or eye makeup. What increases the risk? This condition is more likely to develop in people who:  Are exposed to other people who have the infection.  Wear contact lenses.  Have a sinus infection.  Have had a recent eye injury or surgery.  Have a weak body defense system (immune system).  Have a medical condition that causes dry eyes. What are the signs or symptoms? Symptoms of this condition  include:  Eye redness.  Tearing or watery eyes.  Itchy eyes.  Burning feeling in your eyes.  Thick, yellowish discharge from an eye. This may turn into a crust on the eyelid overnight and cause your eyelids to stick together.  Swollen eyelids.  Blurred vision. How is this diagnosed? Your health care provider can diagnose this condition based on your symptoms and medical history. Your health care provider may also take a sample of discharge from your eye to find the cause of your infection. This is rarely done. How is this treated? Treatment for this condition includes:  Antibiotic eye drops or ointment to clear the infection more quickly and prevent the spread of infection to others.  Oral antibiotic medicines to treat infections that do not respond to drops or ointments, or last longer than 10 days.  Cool, wet cloths (cool compresses) placed on the eyes.  Artificial tears applied 2-6 times a day. Follow these instructions at home: Medicines  Take or apply your antibiotic medicine as told by your health care provider. Do not stop taking or applying the antibiotic even if you start to feel better.  Take or apply over-the-counter and prescription medicines only as told by your health care provider.  Be very careful to avoid touching the edge of your eyelid with the eye drop bottle or the ointment tube when you apply medicines to the affected eye. This will keep you from spreading the infection to your other eye or to other people. Managing discomfort  Gently wipe away any drainage from your eye with a   warm, wet washcloth or a cotton ball.  Apply a cool, clean washcloth to your eye for 10-20 minutes, 3-4 times a day. General instructions  Do not wear contact lenses until the inflammation is gone and your health care provider says it is safe to wear them again. Ask your health care provider how to sterilize or replace your contact lenses before you use them again. Wear glasses  until you can resume wearing contacts.  Avoid wearing eye makeup until the inflammation is gone. Throw away any old eye cosmetics that may be contaminated.  Change or wash your pillowcase every day.  Do not share towels or washcloths. This may spread the infection.  Wash your hands often with soap and water. Use paper towels to dry your hands.  Avoid touching or rubbing your eyes.  Do not drive or use heavy machinery if your vision is blurred. Contact a health care provider if:  You have a fever.  Your symptoms do not get better after 10 days. Get help right away if:  You have a fever and your symptoms suddenly get worse.  You have severe pain when you move your eye.  You have facial pain, redness, or swelling.  You have sudden loss of vision. This information is not intended to replace advice given to you by your health care provider. Make sure you discuss any questions you have with your health care provider. Document Released: 12/18/2004 Document Revised: 04/28/2015 Document Reviewed: 09/30/2014 Elsevier Interactive Patient Education  2017 [employer]vier Inc.  

## 2017-01-18 NOTE — Progress Notes (Signed)
   1/18/20191:08 PM  Chelsea QuartoBriana L Keith 01/23/1995, 22 y.o. female [id number]  Chief Complaint  Patient presents with  . Conjunctivitis    SINCE THIS MORNING WITH DRAINAGE    HPI:   Patient is a 22 y.o. female who presents today for left eye redness, copious drainage, woke up with eye shut this morning. She denies any vision changes, eye pain, photophobia. 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 She denies any uri or allergy sx.   [facility] 01/18/2017 07/14/2015  Decreased Interest 0 0  Down, Depressed, Hopeless 0 0  PHQ - 2 Score 0 0    No Known Allergies  Prior to Admission medications   Medication Sig Start Date End Date Taking? Authorizing Provider  estradiol cypionate (DEPO-ESTRADIOL) 5 MG/ML injection Inject into the muscle every 28 (twenty-eight) days.   Yes [provider]  ALBUTEROL IN Inhale into the lungs as needed.    [provider]  HYDROcodone-acetaminophen (NORCO/VICODIN) 5-325 MG tablet Take 1 tablet by mouth every 4 (four) hours as needed. Patient not taking: Reported on 01/18/2017 09/09/15   Romie Leveehomas, Alicia, MD  ibuprofen (ADVIL,MOTRIN) 200 MG tablet Take 200 mg by mouth every 6 (six) hours as needed.    [provider]    Past Medical History:  Diagnosis Date  . Asthma   . History of congenital cataract   . Pilonidal disease   . Wears glasses     Past Surgical History:  Procedure Laterality Date  . CATARACT EXTRACTION W/ INTRAOCULAR LENS IMPLANT Left 2007  . PILONIDAL CYST EXCISION N/A 09/09/2015   Procedure: EXCISION PILONIDAL CYST;  Surgeon: Romie LeveeAlicia Thomas, MD;  [facility] [place];  Service: General;  Laterality: N/A;    Social History   Tobacco Use  . Smoking status: Never Smoker  . Smokeless tobacco: Never Used  Substance Use Topics  . Alcohol use: No    Alcohol/week: 0.0 oz    Family History  Problem Relation Age of Onset  . Healthy Mother   .  Healthy Father   . Healthy Sister   . Healthy Brother     ROS Per hpi  OBJECTIVE:  Blood pressure 122/62, pulse 92, temperature 98 F (36.7 C), temperature source Oral, height 5' 5.35" (1.66 m), weight 203 lb (92.1 kg), SpO2 99 %.  Physical Exam  Constitutional: She is well-developed, well-nourished, and in no distress.  HENT:  Head: Normocephalic and atraumatic.  Eyes: EOM are normal. Pupils are equal, round, and reactive to light. Right eye exhibits no discharge. Left eye exhibits discharge. Right conjunctiva is not injected. Left conjunctiva is injected.     ASSESSMENT and PLAN  1. Acute conjunctivitis of left eye, unspecified acute conjunctivitis type Discussed supportive measures, importance of hand hygiene and RTC precautions. Patient educational handout given. - trimethoprim-polymyxin b (POLYTRIM) ophthalmic solution; Place 1 drop into the left eye every 6 (six) hours.  Return if symptoms worsen or fail to improve.    Myles LippsIrma M Santiago, MD [facility] [address] [place], [place] [address] Ph.  [phone] Fax [phone]

## 2017-01-30 ENCOUNTER — Encounter: Payer: Self-pay | Admitting: Family Medicine

## 2017-02-26 ENCOUNTER — Emergency Department (HOSPITAL_COMMUNITY): Admission: EM | Admit: 2017-02-26 | Discharge: 2017-02-26 | Discharge status: Against Medical Advice | Payer: 59

## 2017-06-04 ENCOUNTER — Encounter (HOSPITAL_COMMUNITY): Payer: Self-pay | Admitting: *Deleted

## 2017-06-04 ENCOUNTER — Inpatient Hospital Stay (HOSPITAL_COMMUNITY)
Admission: AD | Admit: 2017-06-04 | Discharge: 2017-06-04 | Disposition: A | Discharge status: Home / Self Care | Payer: BLUE CROSS/BLUE SHIELD | Source: Ambulatory Visit | Attending: Family Medicine | Admitting: Family Medicine

## 2017-06-04 ENCOUNTER — Inpatient Hospital Stay (HOSPITAL_COMMUNITY): Payer: BLUE CROSS/BLUE SHIELD

## 2017-06-04 DIAGNOSIS — R109 Unspecified abdominal pain: Secondary | ICD-10-CM | POA: Diagnosis present

## 2017-06-04 DIAGNOSIS — Z3491 Encounter for supervision of normal pregnancy, unspecified, first trimester: Secondary | ICD-10-CM

## 2017-06-04 DIAGNOSIS — Z3A01 Less than 8 weeks gestation of pregnancy: Secondary | ICD-10-CM | POA: Diagnosis not present

## 2017-06-04 DIAGNOSIS — O26891 Other specified pregnancy related conditions, first trimester: Secondary | ICD-10-CM | POA: Diagnosis present

## 2017-06-04 DIAGNOSIS — N898 Other specified noninflammatory disorders of vagina: Secondary | ICD-10-CM

## 2017-06-04 LAB — URINALYSIS, ROUTINE W REFLEX MICROSCOPIC
Bilirubin Urine: NEGATIVE
Glucose, UA: NEGATIVE mg/dL
Hgb urine dipstick: NEGATIVE
KETONES UR: NEGATIVE mg/dL
Nitrite: NEGATIVE
PH: 7 (ref 5.0–8.0)
Protein, ur: NEGATIVE mg/dL
SPECIFIC GRAVITY, URINE: 1.024 (ref 1.005–1.030)

## 2017-06-04 LAB — CBC
HCT: 37.3 % (ref 36.0–46.0)
Hemoglobin: 12.3 g/dL (ref 12.0–15.0)
MCH: 24.5 pg — ABNORMAL LOW (ref 26.0–34.0)
MCHC: 33 g/dL (ref 30.0–36.0)
MCV: 74.2 fL — ABNORMAL LOW (ref 78.0–100.0)
Platelets: 310 10*3/uL (ref 150–400)
RBC: 5.03 MIL/uL (ref 3.87–5.11)
RDW: 15.1 % (ref 11.5–15.5)
WBC: 15.6 10*3/uL — ABNORMAL HIGH (ref 4.0–10.5)

## 2017-06-04 LAB — HCG, QUANTITATIVE, PREGNANCY: hCG, Beta Chain, Quant, S: 27456 m[IU]/mL — ABNORMAL HIGH (ref <5)

## 2017-06-04 LAB — WET PREP, GENITAL
Clue Cells Wet Prep HPF POC: NONE SEEN
Sperm: NONE SEEN
Trich, Wet Prep: NONE SEEN
Yeast Wet Prep HPF POC: NONE SEEN

## 2017-06-04 LAB — POCT PREGNANCY, URINE: Preg Test, Ur: POSITIVE — AB

## 2017-06-04 NOTE — MAU Note (Signed)
PT SAYS SHE STARTED HAVING CRAMPS ON Sunday- THEN TODAY HAS A WHITE MILKY D/C,    TOOK ADVIL - THIS  AM  2 TABS -  SOME RELIEF.   TOOK 2 HPT TODAY- 1 POSITIVE AND  1 NEG .    NO BIRTH CONTROL

## 2017-06-04 NOTE — MAU Provider Note (Signed)
Chief Complaint: Abdominal Pain  SUBJECTIVE HPI: Chelsea Keith is a 22 y.o. G2P0010 at [redacted]w[redacted]d by LMP who presents to maternity admissions reporting abdominal pain and vaginal discharge. She reports abdominal pain started this past Sunday- describes abdominal pain as lower abdominal cramping, rates pain 3/10- has not taken any medication for abdominal pain. She reports associated symptoms of abdominal pain include vaginal discharge. Vaginal discharge started today- describes discharge as white milky with no odor. She reports taking 2 HPT which one was negative and one was positive. She denies vaginal bleeding, vaginal itching/burning, urinary symptoms, h/a, dizziness, n/v, or fever/chills.     Past Medical History:  Diagnosis Date  . Asthma   . History of congenital cataract   . Pilonidal disease   . Wears glasses    Past Surgical History:  Procedure Laterality Date  . CATARACT EXTRACTION W/ INTRAOCULAR LENS IMPLANT Left 2007  . PILONIDAL CYST EXCISION N/A 09/09/2015   Procedure: EXCISION PILONIDAL CYST;  Surgeon: [name], MD;  Location: [facility];  Service: General;  Laterality: N/A;   Social History   Socioeconomic History  . Marital status: Single    Spouse name: Not on file  . Number of children: Not on file  . Years of education: Not on file  . Highest education level: Not on file  Occupational History  . Not on file  Social Needs  . Financial resource strain: Not on file  . Food insecurity:    Worry: Not on file    Inability: Not on file  . Transportation needs:    Medical: Not on file    Non-medical: Not on file  Tobacco Use  . Smoking status: Never Smoker  . Smokeless tobacco: Never Used  Substance and Sexual Activity  . Alcohol use: No    Alcohol/week: 0.0 oz  . Drug use: No  . Sexual activity: Not on file  Lifestyle  . Physical activity:    Days per week: Not on file    Minutes per session: Not on file  . Stress: Not on file  Relationships   . Social connections:    Talks on phone: Not on file    Gets together: Not on file    Attends religious service: Not on file    Active member of club or organization: Not on file    Attends meetings of clubs or organizations: Not on file    Relationship status: Not on file  . Intimate partner violence:    Fear of current or ex partner: Not on file    Emotionally abused: Not on file    Physically abused: Not on file    Forced sexual activity: Not on file  Other Topics Concern  . Not on file  Social History Narrative  . Not on file   No current facility-administered medications on file prior to encounter.    Current Outpatient Medications on File Prior to Encounter  Medication Sig Dispense Refill  . ALBUTEROL IN Inhale into the lungs as needed.    Marland Kitchen estradiol cypionate (DEPO-ESTRADIOL) 5 MG/ML injection Inject into the muscle every 28 (twenty-eight) days.    Marland Kitchen HYDROcodone-acetaminophen (NORCO/VICODIN) 5-325 MG tablet Take 1 tablet by mouth every 4 (four) hours as needed. (Patient not taking: Reported on 01/18/2017) 25 tablet 0  . ibuprofen (ADVIL,MOTRIN) 200 MG tablet Take 200 mg by mouth every 6 (six) hours as needed.    . trimethoprim-polymyxin b (POLYTRIM) ophthalmic solution Place 1 drop into the left eye  every 6 (six) hours. 10 mL 0   No Known Allergies  ROS:  Review of Systems  Respiratory: Negative.   Cardiovascular: Negative.   Gastrointestinal: Positive for abdominal pain. Negative for constipation, diarrhea, nausea and vomiting.  Genitourinary: Positive for vaginal discharge. Negative for difficulty urinating, dysuria, frequency, pelvic pain, urgency, vaginal bleeding and vaginal pain.   I have reviewed patient's Past Medical Hx, Surgical Hx, Family Hx, Social Hx, medications and allergies.   Physical Exam   Vitals:   06/04/17 2145 06/04/17 2340  BP: 126/71 121/69  Pulse: (!) 103 98  Resp: 20   Temp: 98.4 F (36.9 C)   TempSrc: Oral   Weight: 201 lb 4 oz (91.3  kg)   Height: 5\' 4"  (1.626 m)    Constitutional: Well-developed, well-nourished female in no acute distress.  Cardiovascular: normal rate Respiratory: normal effort GI: Abd soft, non-tender. Pos BS x 4 Neurologic: Alert and oriented x 4.   PELVIC EXAM: Cervix pink, visually closed, without lesion, moderate white milky discharge present, vaginal walls and external genitalia normal Bimanual exam: Cervix 0/long/high, firm, anterior, neg CMT, uterus nontender, nonenlarged, adnexa without tenderness, enlargement, or mass  LAB RESULTS Results for orders placed or performed during the hospital encounter of 06/04/17 (from the past 24 hour(s))  Urinalysis, Routine w reflex microscopic     Status: Abnormal   Collection Time: 06/04/17  9:47 PM  Result Value Ref Range   Color, Urine YELLOW YELLOW   APPearance HAZY (A) CLEAR   Specific Gravity, Urine 1.024 1.005 - 1.030   pH 7.0 5.0 - 8.0   Glucose, UA NEGATIVE NEGATIVE mg/dL   Hgb urine dipstick NEGATIVE NEGATIVE   Bilirubin Urine NEGATIVE NEGATIVE   Ketones, ur NEGATIVE NEGATIVE mg/dL   Protein, ur NEGATIVE NEGATIVE mg/dL   Nitrite NEGATIVE NEGATIVE   Leukocytes, UA SMALL (A) NEGATIVE   RBC / HPF 0-5 0 - 5 RBC/hpf   WBC, UA 6-10 0 - 5 WBC/hpf   Bacteria, UA RARE (A) NONE SEEN   Squamous Epithelial / LPF 21-50 0 - 5   Mucus PRESENT   Pregnancy, urine POC     Status: Abnormal   Collection Time: 06/04/17  9:53 PM  Result Value Ref Range   Preg Test, Ur POSITIVE (A) NEGATIVE  Wet prep, genital     Status: Abnormal   Collection Time: 06/04/17 10:17 PM  Result Value Ref Range   Yeast Wet Prep HPF POC NONE SEEN NONE SEEN   Trich, Wet Prep NONE SEEN NONE SEEN   Clue Cells Wet Prep HPF POC NONE SEEN NONE SEEN   WBC, Wet Prep HPF POC FEW (A) NONE SEEN   Sperm NONE SEEN   CBC     Status: Abnormal   Collection Time: 06/04/17 10:19 PM  Result Value Ref Range   WBC 15.6 (H) 4.0 - 10.5 K/uL   RBC 5.03 3.87 - 5.11 MIL/uL   Hemoglobin 12.3 12.0  - 15.0 g/dL   HCT 16.1 09.6 - 04.5 %   MCV 74.2 (L) 78.0 - 100.0 fL   MCH 24.5 (L) 26.0 - 34.0 pg   MCHC 33.0 30.0 - 36.0 g/dL   RDW 40.9 81.1 - 91.4 %   Platelets 310 150 - 400 K/uL  ABO/Rh     Status: None (Preliminary result)   Collection Time: 06/04/17 10:19 PM  Result Value Ref Range   ABO/RH(D)      B POS Performed at [facility], [address] [place],  [place] [address]     --/--/B POS Performed at [facility], [address], [place], [place] [address]  [phone]/04 [phone])  IMAGING US Ob Less Than 14 Weeks With Ob Transvaginal  Result Date: 06/04/2017 CLINICAL DATA:  Acute onset of pelvic cramping. EXAM: OBSTETRIC <14 WK [place] AND TRANSVAGINAL OB US TECHNIQUE: Both transabdominal and transvaginal ultrasound examinations were performed for complete evaluation of the gestation as well as the maternal uterus, adnexal regions, and pelvic cul-de-sac. Transvaginal technique was performed to assess early pregnancy. COMPARISON:  None. FINDINGS: Intrauterine gestational sac: Single; visualized and normal in shape. Yolk sac:  Yes Embryo:  Yes Cardiac Activity: Yes Heart Rate: 88  bpm CRL: 2.5  mm   5 w   5 d                  [place] EDC: 01/30/2018 Subchorionic hemorrhage:  None visualized. Maternal uterus/adnexae: The uterus is otherwise unremarkable in appearance. The ovaries are within normal limits. The right ovary measures 2.6 x 1.9 x 2.5 cm, while the left ovary measures 3.2 x 2.0 x 1.9 cm. No suspicious adnexal masses are seen; there is no evidence for ovarian torsion. No free fluid is seen within the pelvic cul-de-sac. IMPRESSION: Single live intrauterine pregnancy noted, with a crown-rump length of 2.5 mm, corresponding to a gestational age of [redacted] weeks 5 days. This matches the gestational age of [redacted] weeks 2 days by LMP, reflecting an estimated date of delivery of February 02, 2018. Electronically Signed   By: Roanna Raider M.D.   On: 06/04/2017 23:18    MAU Management/MDM: Orders Placed  This Encounter  Procedures  . Wet prep, genital  . US OB LESS THAN 14 WEEKS WITH OB TRANSVAGINAL  . Urinalysis, Routine w reflex microscopic  . CBC  . hCG, quantitative, pregnancy  . Pregnancy, urine POC  . ABO/Rh  . Discharge patient Discharge disposition: 01-Home or Self Care; Discharge patient date: 06/04/2017   Wet prep- negative  ABO/Rh- B Pos  UA- negative for nitrates and ketones   Discussed ultrasound and lab results with patient- will call patient if results of GC/C are positive, patient verbalizes understanding. Encouraged to start PNV and make appointment to be seen to initiate care, OBGYN providers in [place] given and safe medication in pregnancy list given to patient.  Pt discharged.  ASSESSMENT 1. Normal IUP (intrauterine pregnancy) on prenatal ultrasound, first trimester   2. Abdominal pain during pregnancy in first trimester   3. Vaginal discharge during pregnancy in first trimester     PLAN Discharge home Return to MAU for increased abdominal pain and/or vaginal bleeding like a period  Start taking PNV  Make appointment to be seen to initiate care  Will call with results if positive   Follow-up Information    [facility] [place] [facility] [place] [facility] MATERNITY ADMISSIONS Follow up.   Why:  Return to MAU as needed for emergencies and/or increased vaginal bleeding like a period Contact information: [address] [id number] mc [place] [address] [phone]          Allergies as of 06/04/2017   No Known Allergies     Medication List    STOP taking these medications   estradiol cypionate 5 MG/ML injection Commonly known as:  DEPO-ESTRADIOL   HYDROcodone-acetaminophen 5-325 MG tablet Commonly known as:  NORCO/VICODIN   ibuprofen 200 MG tablet Commonly known as:  ADVIL,MOTRIN   trimethoprim-polymyxin b ophthalmic solution Commonly known as:  POLYTRIM  TAKE these medications   ALBUTEROL IN Inhale into the lungs as  needed.       Steward DroneVeronica Adan Beal  Certified Nurse-Midwife 06/07/2017  8:20 AM

## 2017-06-05 LAB — GC/CHLAMYDIA PROBE AMP (~~LOC~~) NOT AT ARMC
Chlamydia: POSITIVE — AB
Neisseria Gonorrhea: NEGATIVE

## 2017-06-05 LAB — ABO/RH: ABO/RH(D): B POS

## 2017-06-07 ENCOUNTER — Other Ambulatory Visit: Payer: Self-pay | Admitting: Certified Nurse Midwife

## 2017-06-07 DIAGNOSIS — O98819 Other maternal infectious and parasitic diseases complicating pregnancy, unspecified trimester: Principal | ICD-10-CM

## 2017-06-07 DIAGNOSIS — A749 Chlamydial infection, unspecified: Secondary | ICD-10-CM

## 2017-06-07 MED ORDER — AZITHROMYCIN 500 MG PO TABS: 1000.0000 mg | ORAL_TABLET | Freq: Once | ORAL | 0 refills | Status: AC

## 2017-06-07 NOTE — Progress Notes (Signed)
Catharina L Waltz tested positive for  Chlamydia. Patient was called by RN, no answer left message to return call to go over results.  Rx sent to pharmacy on file.   Sharyon CableRogers, Shaylyn Bawa C, CNM 06/07/2017 8:23 AM

## 2018-04-27 ENCOUNTER — Encounter (HOSPITAL_COMMUNITY): Payer: Self-pay

## 2018-12-19 IMAGING — US US OB < 14 WEEKS - US OB TV
1 series · 15 of 28 positions shown · non-contrast
Comparison: None.

CLINICAL DATA: Acute onset of pelvic cramping.

EXAM:
OBSTETRIC <14 WK US AND TRANSVAGINAL OB US
TECHNIQUE: Both transabdominal and transvaginal ultrasound examinations were
performed for complete evaluation of the gestation as well as the
maternal uterus, adnexal regions, and pelvic cul-de-sac.
Transvaginal technique was performed to assess early pregnancy.

[Series 1: us ob < 14 weeks - us ob tv · 15 of 37 slices shown]
[im 1/37]
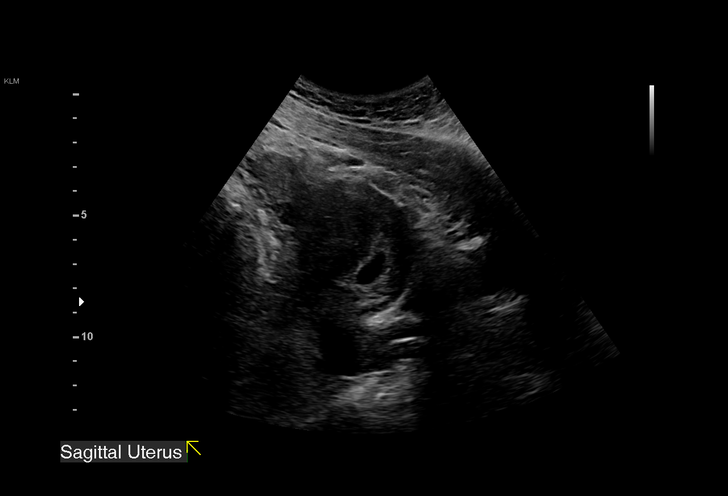
[im 3/37]
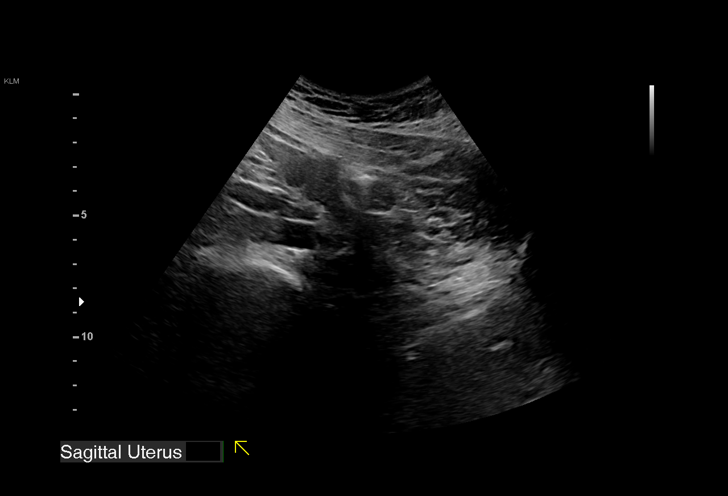
[im 6/37]
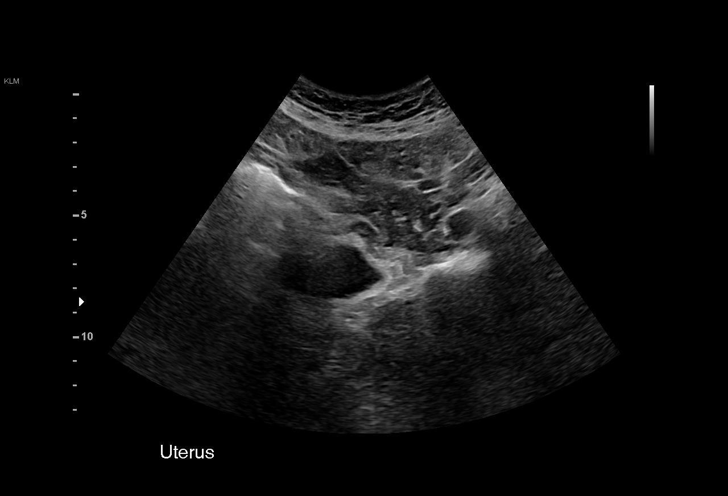
[im 9/37]
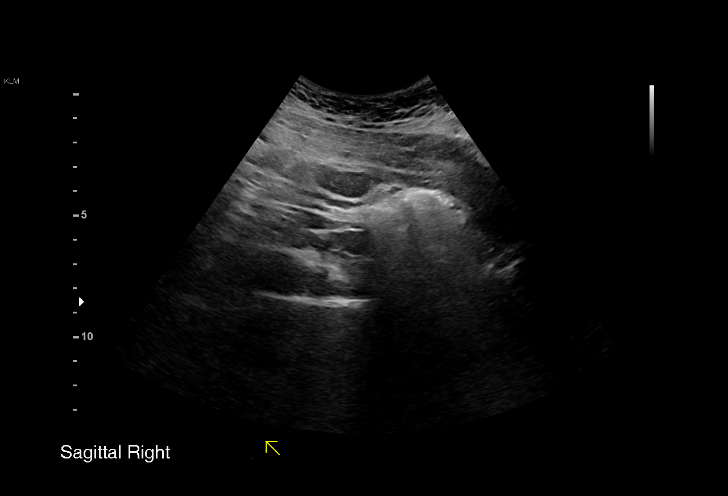
[im 11/37]
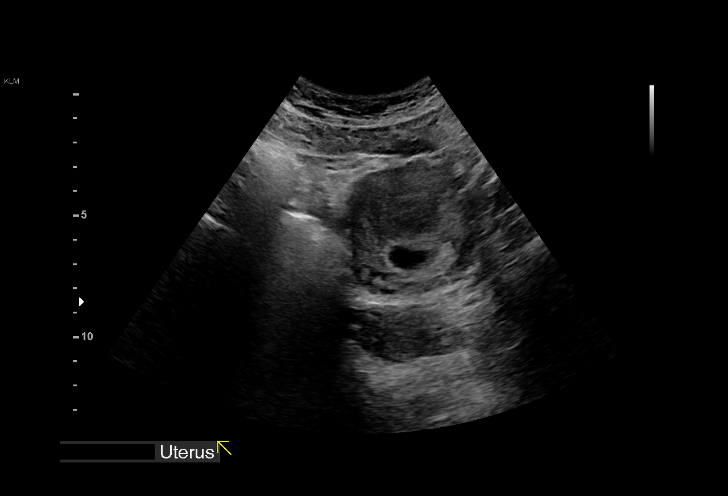
[im 14/37]
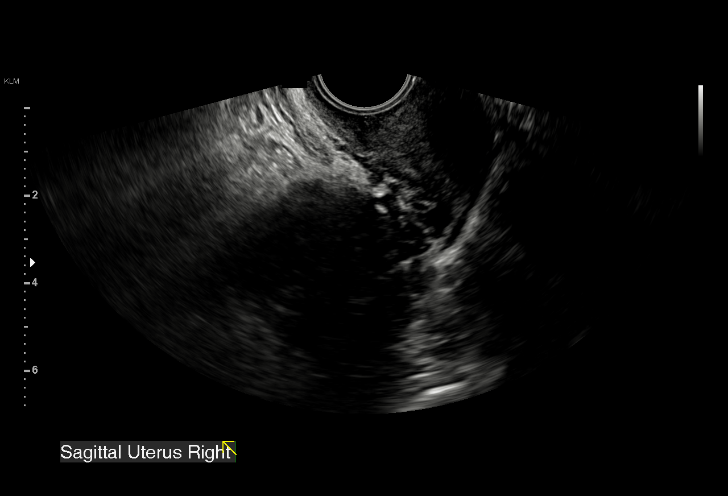
[im 17/37]
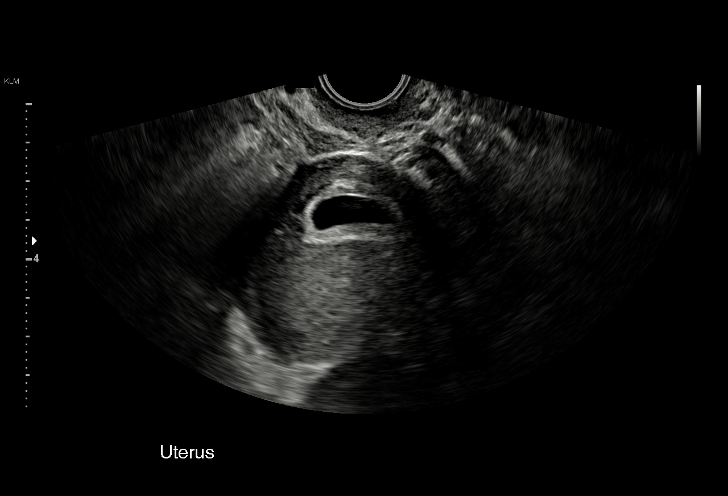
[im 19/37]
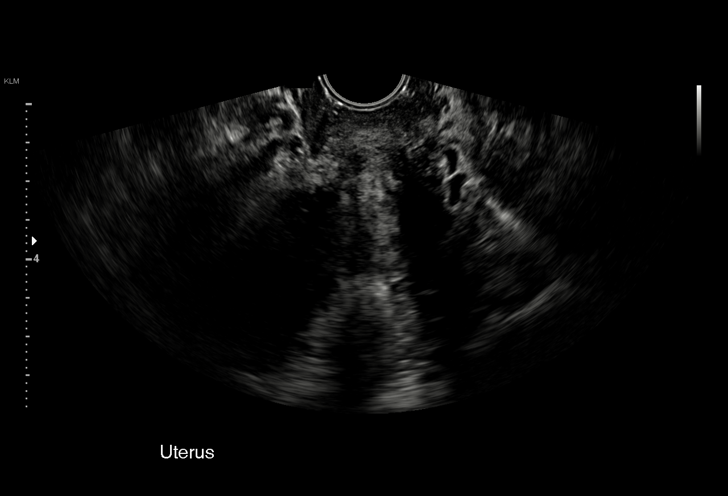
[im 21/37]
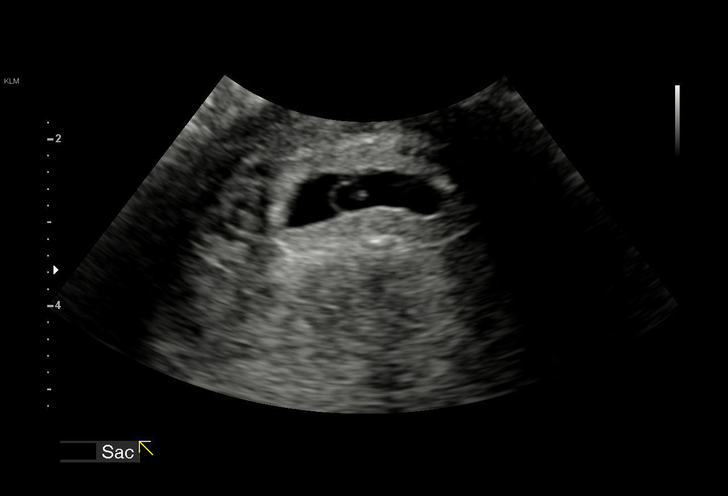
[im 23/37]
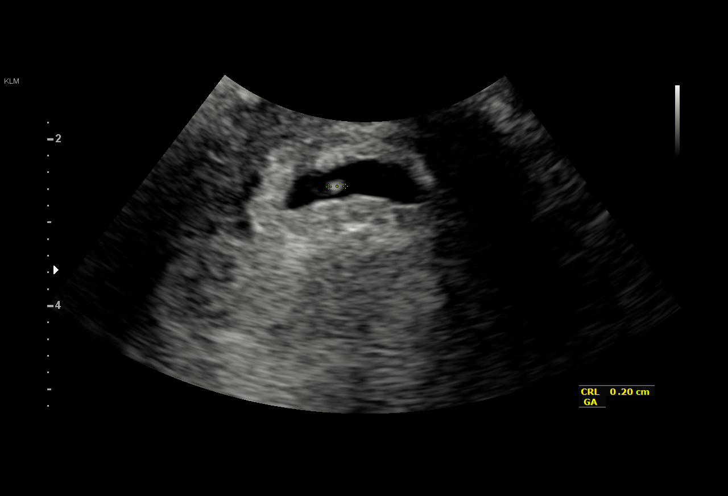
[im 26/37]
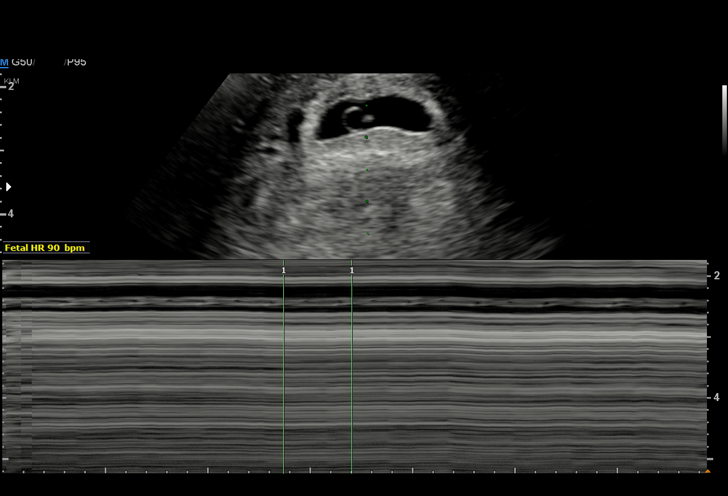
[im 29/37]
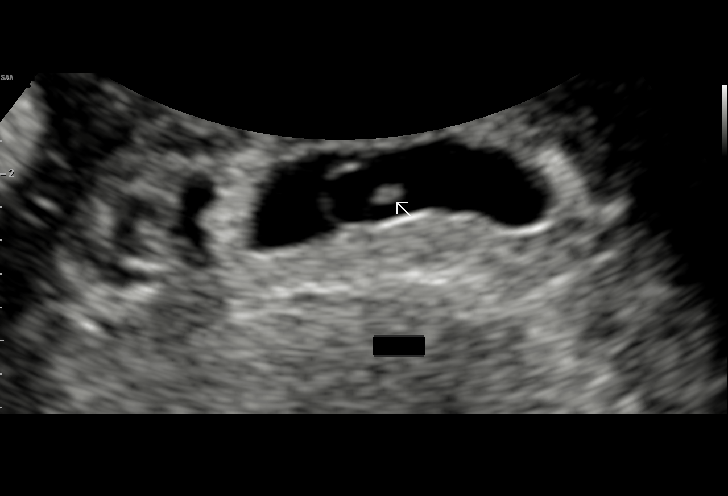
[im 31/37]
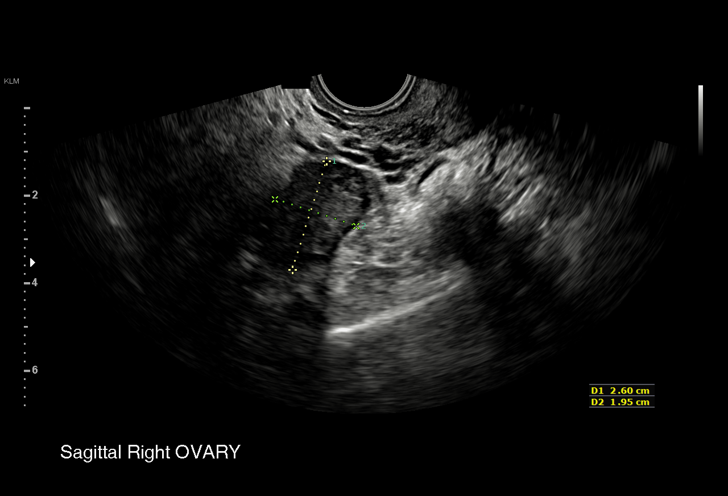
[im 34/37]
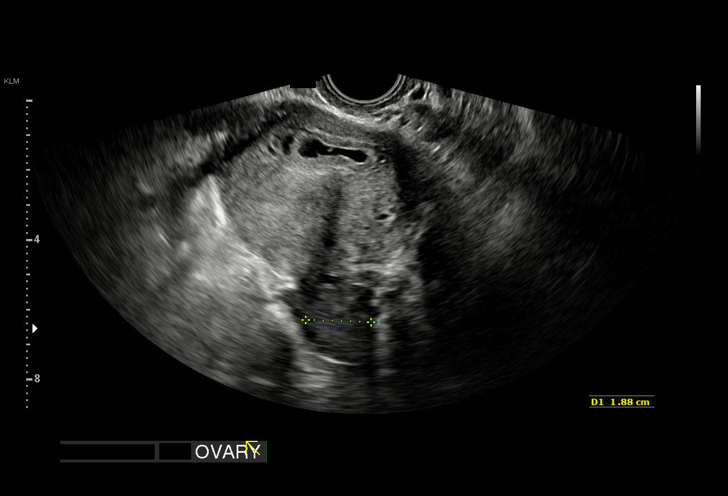
[im 37/37]
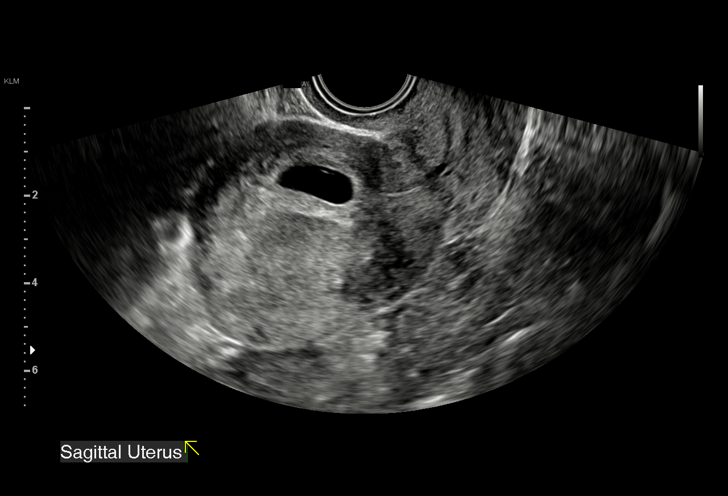

[15 of 28 positions shown; findings below may reference images not displayed]

FINDINGS: Intrauterine gestational sac: Single; visualized and normal in
shape.

Yolk sac:  Yes

Embryo:  Yes

Cardiac Activity: Yes

Heart Rate: 88  bpm

CRL: 2.5  mm   5 w   5 d                  US EDC: 01/30/2018

Subchorionic hemorrhage:  None visualized.

Maternal uterus/adnexae: The uterus is otherwise unremarkable in
appearance.

The ovaries are within normal limits. The right ovary measures 2.6 x
1.9 x 2.5 cm, while the left ovary measures 3.2 x 2.0 x 1.9 cm. No
suspicious adnexal masses are seen; there is no evidence for ovarian
torsion.

No free fluid is seen within the pelvic cul-de-sac.
IMPRESSION: Single live intrauterine pregnancy noted, with a crown-rump length
of 2.5 mm, corresponding to a gestational age of 5 weeks 5 days.
This matches the gestational age of 5 weeks 2 days by LMP,
reflecting an estimated date of delivery February 02, 2018.

## 2019-07-23 ENCOUNTER — Encounter: Admit: 2019-07-23 | Payer: PRIVATE HEALTH INSURANCE | Attending: Family

## 2019-07-23 ENCOUNTER — Ambulatory Visit: Admit: 2019-07-23 | Payer: PRIVATE HEALTH INSURANCE | Attending: Family

## 2019-07-23 DIAGNOSIS — Z113 Encounter for screening for infections with a predominantly sexual mode of transmission: Secondary | ICD-10-CM

## 2019-07-23 DIAGNOSIS — Z01419 Encounter for gynecological examination (general) (routine) without abnormal findings: Secondary | ICD-10-CM

## 2019-07-23 DIAGNOSIS — Z975 Presence of (intrauterine) contraceptive device: Secondary | ICD-10-CM

## 2019-07-23 DIAGNOSIS — Z789 Other specified health status: Secondary | ICD-10-CM

## 2019-07-23 MED ORDER — ONE-A-DAY WOMENS FORMULA ORAL
ORAL | Status: AC
Start: 2019-07-23 — End: ?

## 2019-07-23 NOTE — Progress Notes
[facility] GROUPGYN OFFICE VISITANNUAL SUBJECTIVEBriana Morris is a 24 y.o. young woman who presents for annual exam.  Patient's last menstrual period was 07/06/2019.  She reports her menses are regular and cyclic qmonth, light bleeding with Mirena in place.  Patient has no gyn complaints and denies MMR and IMB.PGYNHXLast Pap: NeverSEXUAL AV:WUJWJXB currently sexually active with two female partners. One partner x1 year, second partner x5 months.Current contraceptive method is Mirena IUD, condoms inconsistentlyMarital Status: singleSOCIAL JY:NWGNFAOZHYQM Hx: Company secretary at [employer]:  Recently started going to the gym-weight lifting and cardioDiet:  RegularTobacco/Alcohol: daily inhalation of marijuana; no tobacco, rare alcoholHEALTH MAINTENANCE:HPV Vaccine (Gardasil):  Has not completed seriesPROBLEM LIST:Patient Active Problem List  Diagnosis Date Noted ? IUD (intrauterine device) in place 07/23/2019 ? 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 Medical history non-contributory  PSHx:Past Surgical History: Procedure Laterality Date ? CYSTECTOMY  2018  buttocks ? DILATION AND CURETTAGE OF UTERUS  2019, 2020, 2021  x3 ? EYE SURGERY  2009 MEDS:Current Outpatient Medications Medication Sig Dispense Refill ? multivit,calc,mins/iron/folic (ONE-A-DAY WOMENS FORMULA ORAL) Take by mouth.   No current facility-administered medications for this visit.  ALLERGIES:No Known AllergiesFAMILY VH:QIONGE History Adopted: Yes Review of Systems Constitutional: Negative.  HENT: Negative. Eyes: Negative.  Respiratory: Negative.  Cardiovascular: Negative.  Gastrointestinal: Negative.  Genitourinary: Negative.  Musculoskeletal: Negative.  Skin: Negative.  Neurological: Negative.  Endo/Heme/Allergies: Negative.  Psychiatric/Behavioral: Negative.  OBJECTIVEHEIGHT:   5' 2 (1.575 m)     WEIGHT:   85.3 kg	BMI:   Body mass index is 34.39 kg/m?Marland KitchenBLOOD PRESSURE:  122/64EXAM:	General:  Appears well, no distress	Skin:  unremarkable 	Neck: Supple. No thyromegaly	Heart: Regular rate and rhythm, no murmur	Lungs: CTAB	Breast:  no LAN, no masses bilaterally	Abdomen:  soft, nontender	External:  normal appearing vulva with no masses, tenderness or lesions	Vagina:  normal	Cervix:  normal appearance, no lesions, no discharge and no CMT. IUD stings visualized	Uterus:  mobile, nontender, with normal size, shape, and consistency	Adnexae:  no masses or tenderness bilaterally	Extrem:  unremarkablePOC Testing:  Unable to leave urine sample todayASSESSMENTAnnual - unremarkable gyn exam	PLAN - Pap smear was performed. ASCCP guidelines reviewed. Will be due in 2024 if normal. - Patient consented for cervical cultures - Serum STI screen ordered per patient request - Highly encouraged to use condoms when sexually active - Gardasil vaccine reviewed and information sheet provided. Patient will consider and call the office if she chooses to receive  - Encouraged regular exercise and adequate calcium / vitamin D intake - RTO in one year and as neededNicole Yannick Steuber, APRN7/22/20219:10 AM

## 2019-07-24 ENCOUNTER — Telehealth: Admit: 2019-07-24 | Payer: PRIVATE HEALTH INSURANCE | Attending: Family

## 2019-07-24 NOTE — Telephone Encounter
[place] called     + chlamydia

## 2019-07-27 ENCOUNTER — Telehealth: Admit: 2019-07-27 | Payer: PRIVATE HEALTH INSURANCE | Attending: Family

## 2019-07-27 MED ORDER — AZITHROMYCIN 500 MG TABLET
500 mg | ORAL_TABLET | Freq: Once | ORAL | 1 refills | Status: AC
Start: 2019-07-27 — End: ?

## 2019-07-27 NOTE — Telephone Encounter
Patient returning my call. We discussed positive Chlamydia results; pap cytology still pending. Patient reports NKA. Azithromycin sent to pharmacy. Patient advised to make her partners aware so they can be tested/treated. Also advised to abstain from sexual intercourse for 7 days post treatment. Will need TOC in 4 weeks, front desk to schedule.

## 2019-07-27 NOTE — Telephone Encounter
Lmom to return call. Need to discuss +Chlamydia and treat.

## 2019-07-28 NOTE — Telephone Encounter
LMOM for Kathleen Morris to call office back.

## 2019-07-29 NOTE — Telephone Encounter
Spoke with [place]. She was aware of her +chlamydia results. Informed her that rx was sent to her pharmacy. We offered treatment for her partners and she said she will call back with their information.

## 2019-07-30 ENCOUNTER — Encounter: Admit: 2019-07-30 | Payer: PRIVATE HEALTH INSURANCE | Attending: Family

## 2019-07-30 NOTE — Other
Letter sent.

## 2019-07-30 NOTE — Telephone Encounter
Reached out to [place] regarding partner testing and treatment. She said she spoke with her partners who declined our help and said they would go to urgent care. Stressed the importance on them receiving treatment and to wait 7 days before having intercourse. Kathleen Morris verbalized understanding.

## 2019-08-24 ENCOUNTER — Ambulatory Visit: Admit: 2019-08-24 | Payer: PRIVATE HEALTH INSURANCE | Attending: Family

## 2019-08-24 DIAGNOSIS — A749 Chlamydial infection, unspecified: Secondary | ICD-10-CM

## 2019-08-24 DIAGNOSIS — Z113 Encounter for screening for infections with a predominantly sexual mode of transmission: Secondary | ICD-10-CM

## 2019-08-24 NOTE — Progress Notes
[facility] GROUPRETURN GYN VISITSUBJECTIVEBriana Morris is a 24 y.o. G4P0040 woman who presents for TOC. She tested positive for Chlamydia on 07/24/19 and was prescribed Azithromycin for treatment on 07/27/19 which she took about two days after it was prescribed. She reports she did notify her partner at the time and he was also treated. She is no longer with that partner and has not been sexually active since treatment. She is asymptomatic today.PROBLEM LISTPatient Active Problem List  Diagnosis Date Noted ? IUD (intrauterine device) in place 07/23/2019 ? 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 Medical history non-contributory  PSHx:Past Surgical History: Procedure Laterality Date ? CYSTECTOMY  2018  buttocks ? DILATION AND CURETTAGE OF UTERUS  2019, 2020, 2021  x3 ? EYE SURGERY  2009 MEDS:Current Outpatient Medications Medication Sig Dispense Refill ? multivit,calc,mins/iron/folic (ONE-A-DAY WOMENS FORMULA ORAL) Take by mouth.   No current facility-administered medications for this visit. OBJECTIVEHEIGHT:   5' 2 (1.575 m)     WEIGHT:   81.6 kg	BMI:   Body mass index is 32.92 kg/m?Marland KitchenBLOOD PRESSURE:  112/66EXAM:  	External:  normal appearing vulva with no masses, tenderness or lesions	Vagina:  normal, no blood	Cervix:  normal appearance, no lesions, no discharge. IUD strings visualized	ASSESSMENT & PLANChlamydia test of cure -NAAT collected and sent- RTO for annual exam or sooner as neededNicole Nelda Severe, APRN8/23/202110:15 AM

## 2019-12-28 ENCOUNTER — Ambulatory Visit: Admit: 2019-12-28 | Payer: PRIVATE HEALTH INSURANCE | Attending: Internal Medicine

## 2019-12-28 DIAGNOSIS — U071 COVID-19: Secondary | ICD-10-CM

## 2024-02-12 ENCOUNTER — Encounter: Admit: 2024-02-12 | Payer: PRIVATE HEALTH INSURANCE | Attending: Obstetrics and Gynecology
